# Patient Record
Sex: Female | Born: 1995 | Race: Black or African American | Hispanic: No | Marital: Single | Smoking: Never smoker
Health system: Southern US, Community
[De-identification: ages and names within clinical notes are randomized; demographics above are authoritative.]

## PROBLEM LIST (undated history)

## (undated) DIAGNOSIS — J302 Other seasonal allergic rhinitis: Secondary | ICD-10-CM

---
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
## 2015-10-16 ENCOUNTER — Emergency Department (HOSPITAL_COMMUNITY)
Admission: EM | Admit: 2015-10-16 | Discharge: 2015-10-16 | Disposition: A | Discharge status: Home / Self Care | Payer: Medicaid Other
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
## 2017-09-30 ENCOUNTER — Encounter (HOSPITAL_COMMUNITY): Payer: Self-pay

## 2017-09-30 ENCOUNTER — Emergency Department (HOSPITAL_COMMUNITY)
Admission: EM | Admit: 2017-09-30 | Discharge: 2017-09-30 | Disposition: A | Discharge status: Home / Self Care | Payer: Self-pay | Attending: Emergency Medicine | Admitting: Emergency Medicine

## 2017-09-30 ENCOUNTER — Emergency Department (HOSPITAL_COMMUNITY): Payer: Self-pay

## 2017-09-30 DIAGNOSIS — S63502A Unspecified sprain of left wrist, initial encounter: Secondary | ICD-10-CM | POA: Insufficient documentation

## 2017-09-30 DIAGNOSIS — Y999 Unspecified external cause status: Secondary | ICD-10-CM | POA: Insufficient documentation

## 2017-09-30 DIAGNOSIS — Y929 Unspecified place or not applicable: Secondary | ICD-10-CM | POA: Insufficient documentation

## 2017-09-30 DIAGNOSIS — S139XXA Sprain of joints and ligaments of unspecified parts of neck, initial encounter: Secondary | ICD-10-CM | POA: Insufficient documentation

## 2017-09-30 DIAGNOSIS — Y939 Activity, unspecified: Secondary | ICD-10-CM | POA: Insufficient documentation
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
## 2017-09-30 NOTE — Discharge Instructions (Signed)
Take over the counter medications such as naprosyn or tylenol as needed for pain, expect to be stiff and sore for the next week or so

## 2017-09-30 NOTE — ED Notes (Signed)
Ortho en route.  

## 2017-09-30 NOTE — ED Notes (Signed)
Pt ambulated out of the ED. She declined d/c vitals and this RN was just able to catch her to give her her papers.
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
## 2018-11-19 ENCOUNTER — Emergency Department (HOSPITAL_COMMUNITY)
Admission: EM | Admit: 2018-11-19 | Discharge: 2018-11-19 | Disposition: A | Discharge status: Home / Self Care | Payer: Self-pay | Attending: Emergency Medicine | Admitting: Emergency Medicine

## 2018-11-19 DIAGNOSIS — M25531 Pain in right wrist: Secondary | ICD-10-CM | POA: Insufficient documentation

## 2018-11-19 DIAGNOSIS — Z79899 Other long term (current) drug therapy: Secondary | ICD-10-CM | POA: Insufficient documentation

## 2018-11-19 MED ORDER — IBUPROFEN 800 MG PO TABS: 800.0000 mg | ORAL_TABLET | Freq: Three times a day (TID) | ORAL | 0 refills | Status: DC

## 2018-11-19 MED ORDER — IBUPROFEN 800 MG PO TABS
800.0000 mg | ORAL_TABLET | Freq: Once | ORAL | Status: AC
  Administered 2018-11-19: 800 mg via ORAL
  Filled 2018-11-19: qty 1

## 2018-11-19 NOTE — ED Provider Notes (Signed)
[facility] EMERGENCY DEPARTMENT Provider Note   CSN: [id number] Arrival date & time: 11/19/18  1649     History   Chief Complaint No chief complaint on file.   HPI Stephanie Madden is a 22 y.o. female.   Wrist Pain  This is a new problem. The current episode started 6 to 12 hours ago. The problem occurs constantly. The problem has been gradually improving. Pertinent negatives include no chest pain. The symptoms are aggravated by twisting. Nothing relieves the symptoms. She has tried nothing for the symptoms.    Past Medical History:  Diagnosis Date  . Seasonal allergies     There are no active problems to display for this patient.   No past surgical history on file.   OB History   None      Home Medications    Prior to Admission medications   Medication Sig Start Date End Date Taking? Authorizing Provider  albuterol (PROVENTIL HFA;VENTOLIN HFA) 108 (90 BASE) MCG/ACT inhaler Inhale 1-2 puffs into the lungs every 6 (six) hours as needed for wheezing or shortness of breath. 12/11/13   Santiago Glad, PA-C  benzonatate (TESSALON) 100 MG capsule Take 1 capsule (100 mg total) by mouth every 8 (eight) hours. 12/11/13   Santiago Glad, PA-C  ibuprofen (ADVIL,MOTRIN) 800 MG tablet Take 1 tablet (800 mg total) by mouth 3 (three) times daily. 11/19/18   Monia Timmers, Barbara Cower, MD  predniSONE (DELTASONE) 20 MG tablet Take 3 tablets (60 mg total) by mouth daily. 12/11/13   Santiago Glad, PA-C    Family History No family history on file.  Social History Social History   Tobacco Use  . Smoking status: Never Smoker  . Smokeless tobacco: Never Used  Substance Use Topics  . Alcohol use: No  . Drug use: No     Allergies   Patient has no known allergies.   Review of Systems Review of Systems  Cardiovascular: Negative for chest pain.  All other systems reviewed and are negative.    Physical Exam Updated Vital Signs BP 133/72 (BP Location: Right  Arm)   Pulse 74   Temp 98.7 F (37.1 C) (Oral)   Resp 18   Ht 5\' 3"  (1.6 m)   Wt 49.9 kg   SpO2 100%   BMI 19.49 kg/m   Physical Exam  Constitutional: She appears well-developed and well-nourished.  HENT:  Head: Normocephalic and atraumatic.  Eyes: Conjunctivae are normal.  Neck: Normal range of motion.  Cardiovascular: Normal rate and regular rhythm.  Pulmonary/Chest: No stridor. No respiratory distress.  Abdominal: Soft. Bowel sounds are normal. She exhibits no distension.  Musculoskeletal: Normal range of motion. She exhibits no edema or tenderness.  Neurological: She is alert.  Skin: Skin is warm and dry.  Nursing note and vitals reviewed.    ED Treatments / Results  Labs (all labs ordered are listed, but only abnormal results are displayed) Labs Reviewed - No data to display  EKG None  Radiology No results found.  Procedures Procedures (including critical care time)  Medications Ordered in ED Medications  ibuprofen (ADVIL,MOTRIN) tablet 800 mg (800 mg Oral Given 11/19/18 1723)     Initial Impression / Assessment and Plan / ED Course  I have reviewed the triage vital signs and the nursing notes.  Pertinent labs & imaging results that were available during my care of the patient were reviewed by me and considered in my medical decision making (see chart for details).  Pain with lifting something heavy to right medial wrist. No deformity. Doubt fx, no need for xr. Will splint/pcp follo wup. Nsaids.   Final Clinical Impressions(s) / ED Diagnoses   Final diagnoses:  Right wrist pain    ED Discharge Orders         Ordered    ibuprofen (ADVIL,MOTRIN) 800 MG tablet  3 times daily     11/19/18 1658           Punam Broussard, Barbara CowerJason, MD 11/19/18 2021

## 2018-11-19 NOTE — ED Notes (Signed)
Declined W/C at D/C and was escorted to lobby by RN. 

## 2019-09-19 ENCOUNTER — Emergency Department (HOSPITAL_COMMUNITY)
Admission: EM | Admit: 2019-09-19 | Discharge: 2019-09-19 | Disposition: A | Discharge status: Home / Self Care | Payer: Self-pay | Attending: Emergency Medicine | Admitting: Emergency Medicine

## 2019-09-19 ENCOUNTER — Encounter (HOSPITAL_COMMUNITY): Payer: Self-pay | Admitting: Emergency Medicine

## 2019-09-19 DIAGNOSIS — Z79899 Other long term (current) drug therapy: Secondary | ICD-10-CM | POA: Insufficient documentation

## 2019-09-19 DIAGNOSIS — Z202 Contact with and (suspected) exposure to infections with a predominantly sexual mode of transmission: Secondary | ICD-10-CM | POA: Insufficient documentation

## 2019-09-19 LAB — URINALYSIS, ROUTINE W REFLEX MICROSCOPIC
Bilirubin Urine: NEGATIVE
Glucose, UA: NEGATIVE mg/dL
Hgb urine dipstick: NEGATIVE
Ketones, ur: 5 mg/dL — AB
Nitrite: NEGATIVE
Protein, ur: NEGATIVE mg/dL
Specific Gravity, Urine: 1.024 (ref 1.005–1.030)
pH: 6 (ref 5.0–8.0)

## 2019-09-19 LAB — I-STAT BETA HCG BLOOD, ED (MC, WL, AP ONLY): I-stat hCG, quantitative: <5 m[IU]/mL (ref <5)

## 2019-09-19 LAB — HIV ANTIBODY (ROUTINE TESTING W REFLEX): HIV Screen 4th Generation wRfx: NONREACTIVE

## 2019-09-19 MED ORDER — METRONIDAZOLE 500 MG PO TABS
2000.0000 mg | ORAL_TABLET | Freq: Once | ORAL | Status: AC
  Administered 2019-09-19: 15:00:00 2000 mg via ORAL
  Filled 2019-09-19: qty 4

## 2019-09-19 NOTE — Discharge Instructions (Addendum)
Please read attached information. If you experience any new or worsening signs or symptoms please return to the emergency room for evaluation. Please follow-up with your primary care provider or specialist as discussed.  °

## 2019-09-19 NOTE — ED Triage Notes (Signed)
Pt reports was told by a female sexual partner that she was positive for trichonosis. Pt herself has no symptoms.

## 2019-09-19 NOTE — ED Provider Notes (Signed)
[facility] EMERGENCY DEPARTMENT Provider Note   CSN: [id number] Arrival date & time: 09/19/19  1126     History   Chief Complaint Chief Complaint  Patient presents with  . Exposure to STD    HPI Stephanie Madden is a 23 y.o. female.     HPI   23 year old female presents today with complaints of STD exposure.  She notes her sexual partner reported she had trichomoniasis.  Patient denies any vaginal symptoms.  Past Medical History:  Diagnosis Date  . Seasonal allergies     There are no active problems to display for this patient.   No past surgical history on file.   OB History   No obstetric history on file.      Home Medications    Prior to Admission medications   Medication Sig Start Date End Date Taking? Authorizing Provider  albuterol (PROVENTIL HFA;VENTOLIN HFA) 108 (90 BASE) MCG/ACT inhaler Inhale 1-2 puffs into the lungs every 6 (six) hours as needed for wheezing or shortness of breath. 12/11/13   Hyman Bible, PA-C  benzonatate (TESSALON) 100 MG capsule Take 1 capsule (100 mg total) by mouth every 8 (eight) hours. 12/11/13   Hyman Bible, PA-C  ibuprofen (ADVIL,MOTRIN) 800 MG tablet Take 1 tablet (800 mg total) by mouth 3 (three) times daily. 11/19/18   Mesner, Corene Cornea, MD  predniSONE (DELTASONE) 20 MG tablet Take 3 tablets (60 mg total) by mouth daily. 12/11/13   Hyman Bible, PA-C    Family History No family history on file.  Social History Social History   Tobacco Use  . Smoking status: Never Smoker  . Smokeless tobacco: Never Used  Substance Use Topics  . Alcohol use: No  . Drug use: No     Allergies   Patient has no known allergies.   Review of Systems Review of Systems  All other systems reviewed and are negative.    Physical Exam Updated Vital Signs BP (!) 120/48   Pulse 62   Temp 98.2 F (36.8 C) (Oral)   Resp 16   LMP 08/25/2019   SpO2 100%   Physical Exam Vitals signs and nursing note  reviewed.  Constitutional:      Appearance: She is well-developed.  HENT:     Head: Normocephalic and atraumatic.  Eyes:     General: No scleral icterus.       Right eye: No discharge.        Left eye: No discharge.     Conjunctiva/sclera: Conjunctivae normal.     Pupils: Pupils are equal, round, and reactive to light.  Neck:     Musculoskeletal: Normal range of motion.     Vascular: No JVD.     Trachea: No tracheal deviation.  Pulmonary:     Effort: Pulmonary effort is normal.     Breath sounds: No stridor.  Neurological:     Mental Status: She is alert and oriented to person, place, and time.     Coordination: Coordination normal.  Psychiatric:        Behavior: Behavior normal.        Thought Content: Thought content normal.        Judgment: Judgment normal.      ED Treatments / Results  Labs (all labs ordered are listed, but only abnormal results are displayed) Labs Reviewed  URINALYSIS, ROUTINE W REFLEX MICROSCOPIC - Abnormal; Notable for the following components:      Result Value   Ketones, ur 5 (*)  Leukocytes,Ua TRACE (*)    Bacteria, UA RARE (*)    All other components within normal limits  HIV ANTIBODY (ROUTINE TESTING W REFLEX)  RPR  I-STAT BETA HCG BLOOD, ED ([facility], [facility], [facility] ONLY)    EKG None  Radiology No results found.  Procedures Procedures (including critical care time)  Medications Ordered in ED Medications  metroNIDAZOLE (FLAGYL) tablet 2,000 mg (2,000 mg Oral Given 09/19/19 1504)     Initial Impression / Assessment and Plan / ED Course  I have reviewed the triage vital signs and the nursing notes.  Pertinent labs & imaging results that were available during my care of the patient were reviewed by me and considered in my medical decision making (see chart for details).        23 year old female presents today with exposure to someone with trichomoniasis.  Patient would not like pelvic exam here today.  I do find this is reasonable to  treat her prophylactically for trichomoniasis with metronidazole here.  Patient will follow-up as an outpatient with the health department, she is given strict return precautions.  She verbalized understanding and agreement to today's plan she was counseled on risks and benefits of medication.  Final Clinical Impressions(s) / ED Diagnoses   Final diagnoses:  STD exposure    ED Discharge Orders    None       Rosalio Loud 09/19/19 1753    Tilden Fossa, MD 09/21/19 [phone]

## 2019-09-21 LAB — RPR: RPR Ser Ql: NONREACTIVE

## 2019-11-17 ENCOUNTER — Encounter: Payer: Self-pay | Admitting: Emergency Medicine

## 2019-11-17 ENCOUNTER — Other Ambulatory Visit: Payer: Self-pay

## 2019-11-17 ENCOUNTER — Ambulatory Visit
Admission: EM | Admit: 2019-11-17 | Discharge: 2019-11-17 | Disposition: A | Discharge status: Home / Self Care | Payer: Self-pay | Attending: Emergency Medicine | Admitting: Emergency Medicine

## 2019-11-17 DIAGNOSIS — T148XXA Other injury of unspecified body region, initial encounter: Secondary | ICD-10-CM

## 2019-11-17 MED ORDER — NAPROXEN 500 MG PO TABS: 500.0000 mg | ORAL_TABLET | Freq: Two times a day (BID) | ORAL | 0 refills | Status: DC

## 2019-11-17 MED ORDER — CYCLOBENZAPRINE HCL 5 MG PO TABS: 5.0000 mg | ORAL_TABLET | Freq: Two times a day (BID) | ORAL | 0 refills | Status: AC | PRN

## 2019-11-17 NOTE — ED Provider Notes (Signed)
[facility]    CSN: [id number] Arrival date & time: 11/17/19  1105      History   Chief Complaint Chief Complaint  Patient presents with  . Chest Pain    HPI Stephanie Madden is a 23 y.o. female with history of seasonal allergies presenting for left-sided rib pain since beginning of this week.  States that pain is nonradiating, worse with deep cough or deep inspiration, certain movements.  States she works at [employer] does a lot of heavy lifting.  Patient denies injury, inciting event, fall.  Patient denies shortness of breath, chest pain, lightheadedness.  Has tried heating pad, APAP with moderate relief.    Past Medical History:  Diagnosis Date  . Seasonal allergies     There are no active problems to display for this patient.   History reviewed. No pertinent surgical history.  OB History   No obstetric history on file.      Home Medications    Prior to Admission medications   Medication Sig Start Date End Date Taking? Authorizing Provider  albuterol (PROVENTIL HFA;VENTOLIN HFA) 108 (90 BASE) MCG/ACT inhaler Inhale 1-2 puffs into the lungs every 6 (six) hours as needed for wheezing or shortness of breath. 12/11/13   Santiago Glad, PA-C  cyclobenzaprine (FLEXERIL) 5 MG tablet Take 1 tablet (5 mg total) by mouth 2 (two) times daily as needed for up to 5 days for muscle spasms. 11/17/19 11/22/19  Hall-Potvin, [place], PA-C  ibuprofen (ADVIL,MOTRIN) 800 MG tablet Take 1 tablet (800 mg total) by mouth 3 (three) times daily. 11/19/18   Mesner, Barbara Cower, MD  naproxen (NAPROSYN) 500 MG tablet Take 1 tablet (500 mg total) by mouth 2 (two) times daily. 11/17/19   Hall-Potvin, [place], PA-C    Family History Family History  Problem Relation Age of Onset  . Healthy Mother   . Healthy Father     Social History Social History   Tobacco Use  . Smoking status: Never Smoker  . Smokeless tobacco: Never Used  Substance Use Topics  . Alcohol use: No  . Drug use: No      Allergies   Patient has no known allergies.   Review of Systems Review of Systems  Constitutional: Negative for fatigue and fever.  HENT: Negative for ear pain, sinus pain, sore throat and voice change.   Eyes: Negative for pain, redness and visual disturbance.  Respiratory: Negative for cough and shortness of breath.   Cardiovascular: Negative for chest pain and palpitations.  Gastrointestinal: Negative for abdominal pain, diarrhea and vomiting.  Musculoskeletal:       Positive for left rib pain  Skin: Negative for rash and wound.  Neurological: Negative for syncope and headaches.     Physical Exam Triage Vital Signs ED Triage Vitals  Enc Vitals Group     BP      Pulse      Resp      Temp      Temp src      SpO2      Weight      Height      Head Circumference      Peak Flow      Pain Score      Pain Loc      Pain Edu?      Excl. in GC?    No data found.  Updated Vital Signs BP 111/68 (BP Location: Right Arm)   Pulse 61   Temp 98 F (36.7 C) (Oral)  Resp 16   LMP 10/29/2019   SpO2 100%   Visual Acuity Right Eye Distance:   Left Eye Distance:   Bilateral Distance:    Right Eye Near:   Left Eye Near:    Bilateral Near:     Physical Exam Constitutional:      General: She is not in acute distress. HENT:     Head: Normocephalic and atraumatic.  Eyes:     General: No scleral icterus.    Pupils: Pupils are equal, round, and reactive to light.  Cardiovascular:     Rate and Rhythm: Normal rate and regular rhythm.  Pulmonary:     Effort: Pulmonary effort is normal. No respiratory distress.     Breath sounds: No wheezing.  Chest:     Chest wall: No mass, swelling, crepitus or edema.    Skin:    Capillary Refill: Capillary refill takes less than 2 seconds.     Coloration: Skin is not jaundiced or pale.     Findings: No rash.  Neurological:     General: No focal deficit present.     Mental Status: She is alert and oriented to person, place,  and time.      UC Treatments / Results  Labs (all labs ordered are listed, but only abnormal results are displayed) Labs Reviewed - No data to display  EKG   Radiology No results found.  Procedures Procedures (including critical care time)  Medications Ordered in UC Medications - No data to display  Initial Impression / Assessment and Plan / UC Course  I have reviewed the triage vital signs and the nursing notes.  Pertinent labs & imaging results that were available during my care of the patient were reviewed by me and considered in my medical decision making (see chart for details).     Patient without trauma, inciting event, shortness of breath: Imaging deferred.  H&P consistent with musculoskeletal strain: We will treat supportively as outlined below.  Return precautions discussed, patient verbalized understanding and is agreeable to plan. Final Clinical Impressions(s) / UC Diagnoses   Final diagnoses:  Musculoskeletal strain     Discharge Instructions     Recommend RICE: rest, ice, compression, elevation as needed for pain.    Heat therapy (hot compress, warm wash red, hot showers, etc.) can help relax muscles and soothe muscle aches. Cold therapy (ice packs) can be used to help swelling both after injury and after prolonged use of areas of chronic pain/aches.  For pain: recommend 350 mg-1000 mg of Tylenol (acetaminophen) and/or 200 mg - 800 mg of Advil (ibuprofen, Motrin) every 8 hours as needed.  May alternate between the two throughout the day as they are generally safe to take together.  DO NOT exceed more than 3000 mg of Tylenol or 3200 mg of ibuprofen in a 24 hour period as this could damage your stomach, kidneys, liver, or increase your bleeding risk.  May take muscle relaxer as needed for severe pain / spasm.  (This medication may cause you to become tired so it is important you do not drink alcohol or operate heavy machinery while on this medication.   Recommend your first dose to be taken before bedtime to monitor for side effects safely)  Return for worsening pain, SOB, chest pain, lightheadedness    ED Prescriptions    Medication Sig Dispense Auth. Provider   naproxen (NAPROSYN) 500 MG tablet Take 1 tablet (500 mg total) by mouth 2 (two) times daily. 30 tablet Hall-Potvin,  [place], PA-C   cyclobenzaprine (FLEXERIL) 5 MG tablet Take 1 tablet (5 mg total) by mouth 2 (two) times daily as needed for up to 5 days for muscle spasms. 10 tablet Hall-Potvin, [place], PA-C     PDMP not reviewed this encounter.   Neldon [place], [place] 11/17/19 1937

## 2019-11-17 NOTE — Discharge Instructions (Signed)
Recommend RICE: rest, ice, compression, elevation as needed for pain.    Heat therapy (hot compress, warm wash red, hot showers, etc.) can help relax muscles and soothe muscle aches. Cold therapy (ice packs) can be used to help swelling both after injury and after prolonged use of areas of chronic pain/aches.  For pain: recommend 350 mg-1000 mg of Tylenol (acetaminophen) and/or 200 mg - 800 mg of Advil (ibuprofen, Motrin) every 8 hours as needed.  May alternate between the two throughout the day as they are generally safe to take together.  DO NOT exceed more than 3000 mg of Tylenol or 3200 mg of ibuprofen in a 24 hour period as this could damage your stomach, kidneys, liver, or increase your bleeding risk.  May take muscle relaxer as needed for severe pain / spasm.  (This medication may cause you to become tired so it is important you do not drink alcohol or operate heavy machinery while on this medication.  Recommend your first dose to be taken before bedtime to monitor for side effects safely)  Return for worsening pain, SOB, chest pain, lightheadedness

## 2019-11-17 NOTE — ED Triage Notes (Signed)
Pt presents to [facility] for assessment of left rib/side pain with occasional cough, which makes it worse.  Also worse with movement.  States its been going on for 4-6 days.  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

## 2020-05-23 ENCOUNTER — Ambulatory Visit (INDEPENDENT_AMBULATORY_CARE_PROVIDER_SITE_OTHER): Payer: Self-pay | Admitting: Lactation Services

## 2020-05-23 ENCOUNTER — Other Ambulatory Visit: Payer: Self-pay

## 2020-05-23 DIAGNOSIS — Z3401 Encounter for supervision of normal first pregnancy, first trimester: Secondary | ICD-10-CM

## 2020-05-23 DIAGNOSIS — Z3201 Encounter for pregnancy test, result positive: Secondary | ICD-10-CM

## 2020-05-23 LAB — POCT PREGNANCY, URINE: Preg Test, Ur: POSITIVE — AB

## 2020-05-23 MED ORDER — PROMETHAZINE HCL 25 MG PO TABS: 25.0000 mg | ORAL_TABLET | Freq: Four times a day (QID) | ORAL | 0 refills | Status: DC | PRN

## 2020-05-23 NOTE — Progress Notes (Signed)
Patient is here for pregnancy test. She reports her periods irregular and she reports she thinks her last period was early April but is unsure. She reports she has irregular periods. She has been trying to conceive since last year.   She is taking PNV daily. She is experiencing Nausea throughout the day. Phenergan ordered.   OTC med list given  Ectopic precautions given and when and where to seek medical care.   Questions answered.

## 2020-05-24 NOTE — Progress Notes (Signed)
Patient seen and assessed by nursing staff.  Agree with documentation and plan.  

## 2020-05-28 ENCOUNTER — Encounter (HOSPITAL_COMMUNITY): Payer: Self-pay | Admitting: Obstetrics and Gynecology

## 2020-05-28 ENCOUNTER — Other Ambulatory Visit: Payer: Self-pay

## 2020-05-28 ENCOUNTER — Inpatient Hospital Stay (HOSPITAL_COMMUNITY)
Admission: EM | Admit: 2020-05-28 | Discharge: 2020-05-28 | Disposition: A | Discharge status: Home / Self Care | Payer: Medicaid Other | Attending: Obstetrics and Gynecology | Admitting: Obstetrics and Gynecology

## 2020-05-28 ENCOUNTER — Inpatient Hospital Stay (HOSPITAL_COMMUNITY): Payer: Medicaid Other

## 2020-05-28 DIAGNOSIS — Z3A01 Less than 8 weeks gestation of pregnancy: Secondary | ICD-10-CM | POA: Diagnosis not present

## 2020-05-28 DIAGNOSIS — O26891 Other specified pregnancy related conditions, first trimester: Secondary | ICD-10-CM

## 2020-05-28 DIAGNOSIS — Z3490 Encounter for supervision of normal pregnancy, unspecified, unspecified trimester: Secondary | ICD-10-CM

## 2020-05-28 DIAGNOSIS — R102 Pelvic and perineal pain: Secondary | ICD-10-CM | POA: Insufficient documentation

## 2020-05-28 DIAGNOSIS — K0889 Other specified disorders of teeth and supporting structures: Secondary | ICD-10-CM | POA: Diagnosis not present

## 2020-05-28 LAB — CBC
HCT: 38.3 % (ref 36.0–46.0)
Hemoglobin: 12.7 g/dL (ref 12.0–15.0)
MCH: 27.7 pg (ref 26.0–34.0)
MCHC: 33.2 g/dL (ref 30.0–36.0)
MCV: 83.4 fL (ref 80.0–100.0)
Platelets: 297 10*3/uL (ref 150–400)
RBC: 4.59 MIL/uL (ref 3.87–5.11)
RDW: 14.5 % (ref 11.5–15.5)
WBC: 11.9 10*3/uL — ABNORMAL HIGH (ref 4.0–10.5)
nRBC: 0 % (ref 0.0–0.2)

## 2020-05-28 LAB — URINALYSIS, ROUTINE W REFLEX MICROSCOPIC
Bilirubin Urine: NEGATIVE
Glucose, UA: NEGATIVE mg/dL
Hgb urine dipstick: NEGATIVE
Ketones, ur: NEGATIVE mg/dL
Leukocytes,Ua: NEGATIVE
Nitrite: NEGATIVE
Protein, ur: NEGATIVE mg/dL
Specific Gravity, Urine: 1.016 (ref 1.005–1.030)
pH: 7 (ref 5.0–8.0)

## 2020-05-28 LAB — ABO/RH: ABO/RH(D): O POS

## 2020-05-28 LAB — WET PREP, GENITAL
Sperm: NONE SEEN
Trich, Wet Prep: NONE SEEN
WBC, Wet Prep HPF POC: NONE SEEN
Yeast Wet Prep HPF POC: NONE SEEN

## 2020-05-28 LAB — HCG, QUANTITATIVE, PREGNANCY: hCG, Beta Chain, Quant, S: 48298 m[IU]/mL — ABNORMAL HIGH (ref <5)

## 2020-05-28 MED ORDER — OXYCODONE-ACETAMINOPHEN 5-325 MG PO TABS: 1.0000 | ORAL_TABLET | ORAL | 0 refills | Status: AC | PRN

## 2020-05-28 NOTE — [facility] Note (Signed)
Sent up from ER. When woke up this morning, had a little discomfort on rt side.  Has continued, comes and goes. preg confirmed at [facility].

## 2020-05-28 NOTE — [facility] Note (Signed)
Blood work drawn. Plan explained, pt to restroom, UA and vag self swabs collected.

## 2020-05-28 NOTE — ED Provider Notes (Signed)
MSE was initiated and I personally evaluated the patient and placed orders (if any) at  1:01 PM on May 28, 2020.  24 year old female presents to the ED due to right lower quadrant abdominal pain that started earlier this morning.  Patient describes pain as intermittent and sharp in nature.  She had a positive pregnancy test on 6/3. LMP early April.  Denies vaginal bleeding and vaginal fluid.  This is her first pregnancy.  No previous miscarriages.  No history of ectopic pregnancy.  Denies vaginal discharge. G1P0.  On exam, abdomen soft, nondistended with right lower quadrant tenderness.  No rebound or guarding. + Rovsing sign. Vitals all within normal limits.  Discussed patient with Rolitta at [facility] who agrees to accept patient for further work-up.   The patient appears stable so that the remainder of the MSE may be completed by another provider.   Mannie Stabile, PA-C 05/28/20 1304    Derwood Kaplan, MD 05/28/20 1311

## 2020-05-28 NOTE — MAU Provider Note (Signed)
History     CSN: [id number]  Arrival date and time: 05/28/20 1243   First Provider Initiated Contact with Patient 05/28/20 1616      Chief Complaint  Patient presents with  . Abdominal Pain  . Possible Pregnancy   Ms. Stephanie Madden is a 24 y.o. year old G1P0 female at [redacted]w[redacted]d weeks gestation who was sent to MAU by [facility] reporting right-sided abdominal discomfort since this morning.  She states it has continued intermittently.  Positive pregnancy test in the [facility] emergency department.  She also has a complaint of her wisdom tooth coming in and experiencing dental pain from that.  She is scheduled to receive an ultrasound at the [facility] on Thursday, May 30, 2020.  She plans to receive her care there as well.   OB History    Gravida  1   Para      Term      Preterm      AB      Living        SAB      TAB      Ectopic      Multiple      Live Births              Past Medical History:  Diagnosis Date  . Seasonal allergies     History reviewed. No pertinent surgical history.  Family History  Problem Relation Age of Onset  . Healthy Mother   . Healthy Father     Social History   Tobacco Use  . Smoking status: Never Smoker  . Smokeless tobacco: Never Used  Substance Use Topics  . Alcohol use: No  . Drug use: Yes    Types: Marijuana    Comment: last use was a couple weeks ago from 05/28/20    Allergies: No Known Allergies  Medications Prior to Admission  Medication Sig Dispense Refill Last Dose  . Prenatal Vit-Fe Fumarate-FA (PRENATAL MULTIVITAMIN) TABS tablet Take 1 tablet by mouth daily at 12 noon.   05/27/2020 at Unknown time  . promethazine (PHENERGAN) 25 MG tablet Take 1 tablet (25 mg total) by mouth every 6 (six) hours as needed for nausea or vomiting. 30 tablet 0 05/27/2020 at Unknown time  . albuterol (PROVENTIL HFA;VENTOLIN HFA) 108 (90 BASE) MCG/ACT inhaler Inhale 1-2 puffs into the lungs every 6  (six) hours as needed for wheezing or shortness of breath. (Patient not taking: Reported on 05/23/2020) 1 Inhaler 0 Unknown at Unknown time  . ibuprofen (ADVIL,MOTRIN) 800 MG tablet Take 1 tablet (800 mg total) by mouth 3 (three) times daily. (Patient not taking: Reported on 05/23/2020) 21 tablet 0 Unknown at Unknown time  . naproxen (NAPROSYN) 500 MG tablet Take 1 tablet (500 mg total) by mouth 2 (two) times daily. (Patient not taking: Reported on 05/23/2020) 30 tablet 0 Unknown at Unknown time    Review of Systems  Constitutional: Negative.   HENT: Negative.   Eyes: Negative.   Respiratory: Negative.   Cardiovascular: Negative.   Gastrointestinal: Negative.   Endocrine: Negative.   Genitourinary: Positive for pelvic pain (RT side "discomfort").  Musculoskeletal: Negative.   Skin: Negative.   Allergic/Immunologic: Negative.   Neurological: Negative.   Hematological: Negative.   Psychiatric/Behavioral: Negative.    Physical Exam   Blood pressure 130/63, pulse 80, temperature 98.5 F (36.9 C), temperature source Oral, resp. rate 18, height 5\' 2"  (1.575 m), weight 53.5 kg, last menstrual  period 04/03/2020, SpO2 100 %.  Physical Exam  Constitutional: She is oriented to person, place, and time.  HENT:  Head: Normocephalic and atraumatic.  Nose: Nose normal.  Eyes: Pupils are equal, round, and reactive to light.  Cardiovascular: Normal rate and regular rhythm.  Respiratory: Effort normal.  GI: Soft. Normal appearance.  Musculoskeletal:        General: Normal range of motion.     Cervical back: Normal range of motion.  Neurological: She is alert and oriented to person, place, and time.  Skin: Skin is warm and dry.  Psychiatric: Her behavior is normal. Mood, judgment and thought content normal.   Physical Exam Constitutional:      Appearance: Normal appearance. She is normal weight.  HENT:     Head: Normocephalic and atraumatic.     Nose: Nose normal.  Eyes:     Pupils: Pupils are  equal, round, and reactive to light.  Cardiovascular:     Rate and Rhythm: Normal rate and regular rhythm.  Pulmonary:     Effort: Pulmonary effort is normal.  Abdominal:     Palpations: Abdomen is soft.  Musculoskeletal:        General: Normal range of motion.     Cervical back: Normal range of motion.  Neurological:     General: No focal deficit present.     Mental Status: She is alert and oriented to person, place, and time.  Skin:    General: Skin is warm and dry.  Psychiatric:        Mood and Affect: Mood normal.        Behavior: Behavior normal.        Thought Content: Thought content normal.        Judgment: Judgment normal.    MAU Course  Procedures  MDM [facility] UPT CBC ABO/Rh HCG Wet Prep GC/CT -- pending HIV -- pending OB < 14 wks [place] with TV  Results for orders placed or performed during the hospital encounter of 05/28/20 (from the past 24 hour(s))  CBC     Status: Abnormal   Collection Time: 05/28/20  1:36 PM  Result Value Ref Range   WBC 11.9 (H) 4.0 - 10.5 K/uL   RBC 4.59 3.87 - 5.11 MIL/uL   Hemoglobin 12.7 12.0 - 15.0 g/dL   HCT 82.5 00.3 - 70.4 %   MCV 83.4 80.0 - 100.0 fL   MCH 27.7 26.0 - 34.0 pg   MCHC 33.2 30.0 - 36.0 g/dL   RDW 88.8 91.6 - 94.5 %   Platelets 297 150 - 400 K/uL   nRBC 0.0 0.0 - 0.2 %  hCG, quantitative, pregnancy     Status: Abnormal   Collection Time: 05/28/20  1:36 PM  Result Value Ref Range   hCG, Beta Chain, Quant, S 48,298 (H) <5 mIU/mL  ABO/Rh     Status: None   Collection Time: 05/28/20  1:36 PM  Result Value Ref Range   ABO/RH(D) O POS    No rh immune globuloin      NOT A RH IMMUNE GLOBULIN CANDIDATE, PT RH POSITIVE Performed at [facility], [address], [place], [place] [address]   Wet prep, genital     Status: Abnormal   Collection Time: 05/28/20  1:50 PM   Specimen: Vaginal  Result Value Ref Range   Yeast Wet Prep HPF POC NONE SEEN NONE SEEN   Trich, Wet Prep NONE SEEN NONE SEEN   Clue Cells Wet  Prep HPF POC FEW (A) NONE SEEN   WBC, Wet Prep HPF POC NONE SEEN NONE SEEN   Sperm NONE SEEN   Urinalysis, Routine w reflex microscopic     Status: Abnormal   Collection Time: 05/28/20  2:00 PM  Result Value Ref Range   Color, Urine YELLOW YELLOW   APPearance HAZY (A) CLEAR   Specific Gravity, Urine 1.016 1.005 - 1.030   pH 7.0 5.0 - 8.0   Glucose, UA NEGATIVE NEGATIVE mg/dL   Hgb urine dipstick NEGATIVE NEGATIVE   Bilirubin Urine NEGATIVE NEGATIVE   Ketones, ur NEGATIVE NEGATIVE mg/dL   Protein, ur NEGATIVE NEGATIVE mg/dL   Nitrite NEGATIVE NEGATIVE   Leukocytes,Ua NEGATIVE NEGATIVE    US OB LESS THAN 14 WEEKS WITH OB TRANSVAGINAL  Result Date: 05/28/2020 CLINICAL DATA:  Pelvic pain EXAM: OBSTETRIC <14 WK [place] AND TRANSVAGINAL OB US TECHNIQUE: Both transabdominal and transvaginal ultrasound examinations were performed for complete evaluation of the gestation as well as the maternal uterus, adnexal regions, and pelvic cul-de-sac. Transvaginal technique was performed to assess early pregnancy. COMPARISON:  None. FINDINGS: Intrauterine gestational sac: Single Yolk sac:  Visualized. Embryo:  Visualized. Cardiac Activity: Visualized. Heart Rate: 112 bpm CRL:  4.5 mm   6 w   1 d                  [place] EDC: 01/20/2021 Subchorionic hemorrhage:  None visualized. Maternal uterus/adnexae: Left ovary measures 2.8 x 1.6 x 2.2 cm and the right ovary measures 3.7 x 1.8 x 2.3 cm. Likely corpus luteum cyst within the right ovary measures approximately 1.7 cm. No free fluid. IMPRESSION: 1. Single live intrauterine pregnancy as above, estimated age 4 weeks and 1 day. Electronically Signed   By: Randa Ngo M.D.   On: 05/28/2020 15:10     Assessment and Plan  Intrauterine pregnancy  - Seek PNC in 4 wks at [facility]  Pelvic pain in pregnancy, antepartum, first trimester  - Information provided on abdominal pain in pregnancy   Pain, dental - Rx for Percocet 5/325 mg 1 tablet every 4 hrs prn pain  - Discharge  home - Call [facility] to schedule PNC at 10-[redacted] wks gestation - Patient verbalized an understanding of the plan of care and agrees.    Laury Deep, MSN, CNM 05/28/2020, 4:16 PM

## 2020-05-28 NOTE — Discharge Instructions (Signed)

## 2020-05-28 NOTE — ED Notes (Signed)
Report given to MAU RN by this RN.

## 2020-05-28 NOTE — ED Notes (Signed)
PA Aberman in to assess patient for possible transport to MAU.

## 2020-05-29 LAB — GC/CHLAMYDIA PROBE AMP (~~LOC~~) NOT AT ARMC
Chlamydia: NEGATIVE
Comment: NEGATIVE
Comment: NORMAL
Neisseria Gonorrhea: NEGATIVE

## 2020-05-30 ENCOUNTER — Ambulatory Visit: Payer: Self-pay

## 2020-06-05 ENCOUNTER — Inpatient Hospital Stay (HOSPITAL_COMMUNITY)
Admission: AD | Admit: 2020-06-05 | Discharge: 2020-06-05 | Disposition: A | Discharge status: Home / Self Care | Payer: Medicaid Other | Attending: Obstetrics & Gynecology | Admitting: Obstetrics & Gynecology

## 2020-06-05 ENCOUNTER — Encounter (HOSPITAL_COMMUNITY): Payer: Self-pay | Admitting: Obstetrics & Gynecology

## 2020-06-05 ENCOUNTER — Other Ambulatory Visit: Payer: Self-pay

## 2020-06-05 DIAGNOSIS — O26891 Other specified pregnancy related conditions, first trimester: Secondary | ICD-10-CM | POA: Diagnosis not present

## 2020-06-05 DIAGNOSIS — Z3A01 Less than 8 weeks gestation of pregnancy: Secondary | ICD-10-CM | POA: Diagnosis not present

## 2020-06-05 DIAGNOSIS — O219 Vomiting of pregnancy, unspecified: Secondary | ICD-10-CM | POA: Diagnosis not present

## 2020-06-05 DIAGNOSIS — R1030 Lower abdominal pain, unspecified: Secondary | ICD-10-CM | POA: Insufficient documentation

## 2020-06-05 DIAGNOSIS — R112 Nausea with vomiting, unspecified: Secondary | ICD-10-CM | POA: Diagnosis present

## 2020-06-05 DIAGNOSIS — R109 Unspecified abdominal pain: Secondary | ICD-10-CM

## 2020-06-05 LAB — URINALYSIS, ROUTINE W REFLEX MICROSCOPIC
Bacteria, UA: NONE SEEN
Bilirubin Urine: NEGATIVE
Glucose, UA: 50 mg/dL — AB
Hgb urine dipstick: NEGATIVE
Ketones, ur: 80 mg/dL — AB
Leukocytes,Ua: NEGATIVE
Nitrite: NEGATIVE
Protein, ur: 100 mg/dL — AB
Specific Gravity, Urine: 1.038 — ABNORMAL HIGH (ref 1.005–1.030)
pH: 5 (ref 5.0–8.0)

## 2020-06-05 MED ORDER — ONDANSETRON HCL 4 MG PO TABS: 4.0000 mg | ORAL_TABLET | Freq: Three times a day (TID) | ORAL | 0 refills | Status: DC | PRN

## 2020-06-05 MED ORDER — METOCLOPRAMIDE HCL 10 MG PO TABS
10.0000 mg | ORAL_TABLET | Freq: Three times a day (TID) | ORAL | 2 refills | Status: DC | PRN
Start: 2020-06-05 — End: 2021-10-19

## 2020-06-05 MED ORDER — LACTATED RINGERS IV BOLUS
1000.0000 mL | Freq: Once | INTRAVENOUS | Status: AC
  Administered 2020-06-05: 1000 mL via INTRAVENOUS

## 2020-06-05 MED ORDER — ONDANSETRON HCL 4 MG/2ML IJ SOLN
4.0000 mg | Freq: Once | INTRAMUSCULAR | Status: AC
  Administered 2020-06-05: 4 mg via INTRAVENOUS
  Filled 2020-06-05: qty 2

## 2020-06-05 MED ORDER — FAMOTIDINE IN NACL 20-0.9 MG/50ML-% IV SOLN
20.0000 mg | Freq: Once | INTRAVENOUS | Status: AC
  Administered 2020-06-05: 20 mg via INTRAVENOUS
  Filled 2020-06-05: qty 50

## 2020-06-05 MED ORDER — M.V.I. ADULT IV INJ
Freq: Once | INTRAVENOUS | Status: AC
  Filled 2020-06-05: qty 1000

## 2020-06-05 MED ORDER — PROMETHAZINE HCL 25 MG/ML IJ SOLN
12.5000 mg | Freq: Once | INTRAMUSCULAR | Status: AC
  Administered 2020-06-05: 12.5 mg via INTRAVENOUS
  Filled 2020-06-05: qty 1

## 2020-06-05 MED ORDER — GLYCOPYRROLATE 2 MG PO TABS: 2.0000 mg | ORAL_TABLET | Freq: Three times a day (TID) | ORAL | 3 refills | Status: DC | PRN

## 2020-06-05 MED ORDER — GLYCOPYRROLATE 0.2 MG/ML IJ SOLN
0.2000 mg | Freq: Once | INTRAMUSCULAR | Status: AC
  Administered 2020-06-05: 0.2 mg via INTRAVENOUS
  Filled 2020-06-05: qty 1

## 2020-06-05 NOTE — Discharge Instructions (Signed)
[facility] Prenatal Care Providers          [facility] locations:  Hours may vary. Please call for an appointment  [facility] @ [facility]  [address], [place] [phone]  [facility] @ [facility]  [address], [place] [phone]  [facility] @ [facility]   [address], [place]  [phone]  [facility] @ [facility] [facility]       [address], [place], [place] [phone]            [facility] @ [place]     [address], [place], [place] [phone]          [facility] @ [place]   [address], [place], [place] [phone]     [facility] @ [facility]  [address] , [place], [place]  [phone] 

## 2020-06-05 NOTE — MAU Provider Note (Signed)
Chief Complaint: Morning Sickness, Nausea, Emesis, and Cramping   First Provider Initiated Contact with Patient 06/05/20 1419      SUBJECTIVE HPI: Stephanie Madden is a 24 y.o. G1P0 at [redacted]w[redacted]d by early ultrasound who presents to maternity admissions reporting nausea with vomiting 10+ times in 24 hours, abdominal cramping and possible bleeding. Pt noticed spots on her sheets that she thought might be blood. There has been no blood when wiping or noticed in her underwear.  She denies vaginal itching/burning, urinary symptoms, h/a, dizziness, or fever/chills.     Location: lower abdomen Quality: cramping Severity: 5/10 on pain scale Duration: 2 days Timing: intermittent Modifying factors: PO Phenergan is not staying down Associated signs and symptoms: n/v  HPI  Past Medical History:  Diagnosis Date  . Seasonal allergies    History reviewed. No pertinent surgical history. Social History   Socioeconomic History  . Marital status: Single    Spouse name: Not on file  . Number of children: Not on file  . Years of education: Not on file  . Highest education level: Not on file  Occupational History  . Not on file  Tobacco Use  . Smoking status: Never Smoker  . Smokeless tobacco: Never Used  Vaping Use  . Vaping Use: Some days  . Start date: 04/20/2020  Substance and Sexual Activity  . Alcohol use: No  . Drug use: Yes    Types: Marijuana    Comment: last use was a couple weeks ago from 05/28/20  . Sexual activity: Yes    Birth control/protection: None  Other Topics Concern  . Not on file  Social History Narrative  . Not on file   Social Determinants of Health   Financial Resource Strain:   . Difficulty of Paying Living Expenses:   Food Insecurity:   . Worried About Programme researcher, broadcasting/film/video in the Last Year:   . Barista in the Last Year:   Transportation Needs:   . Freight forwarder (Medical):   Marland Kitchen Lack of Transportation (Non-Medical):   Physical Activity:   . Days  of Exercise per Week:   . Minutes of Exercise per Session:   Stress:   . Feeling of Stress :   Social Connections:   . Frequency of Communication with Friends and Family:   . Frequency of Social Gatherings with Friends and Family:   . Attends Religious Services:   . Active Member of Clubs or Organizations:   . Attends Banker Meetings:   Marland Kitchen Marital Status:   Intimate Partner Violence:   . Fear of Current or Ex-Partner:   . Emotionally Abused:   Marland Kitchen Physically Abused:   . Sexually Abused:    No current facility-administered medications on file prior to encounter.   Current Outpatient Medications on File Prior to Encounter  Medication Sig Dispense Refill  . albuterol (PROVENTIL HFA;VENTOLIN HFA) 108 (90 BASE) MCG/ACT inhaler Inhale 1-2 puffs into the lungs every 6 (six) hours as needed for wheezing or shortness of breath. 1 Inhaler 0  . Prenatal Vit-Fe Fumarate-FA (PRENATAL MULTIVITAMIN) TABS tablet Take 1 tablet by mouth daily at 12 noon.    . promethazine (PHENERGAN) 25 MG tablet Take 1 tablet (25 mg total) by mouth every 6 (six) hours as needed for nausea or vomiting. 30 tablet 0   No Known Allergies  ROS:  Review of Systems  Constitutional: Negative for chills, fatigue and fever.  Respiratory: Negative for shortness of breath.  Cardiovascular: Negative for chest pain.  Gastrointestinal: Positive for abdominal pain, nausea and vomiting.  Genitourinary: Negative for difficulty urinating, dysuria, flank pain, pelvic pain, vaginal bleeding, vaginal discharge and vaginal pain.  Neurological: Negative for dizziness and headaches.  Psychiatric/Behavioral: Negative.      I have reviewed patient's Past Medical Hx, Surgical Hx, Family Hx, Social Hx, medications and allergies.   Physical Exam   Patient Vitals for the past 24 hrs:  BP Temp Temp src Pulse Resp SpO2 Height Weight  06/05/20 1237 134/61 97.7 F (36.5 C) Oral 68 20 100 % 5\' 3"  (1.6 m) 50.2 kg    Constitutional: Well-developed, well-nourished female in no acute distress.  Cardiovascular: normal rate Respiratory: normal effort GI: Abd soft, non-tender. Pos BS x 4 MS: Extremities nontender, no edema, normal ROM Neurologic: Alert and oriented x 4.  GU: Neg CVAT.  PELVIC EXAM: Cervix pink, visually closed, without lesion, scant Felter creamy discharge, vaginal walls and external genitalia normal Bimanual exam: Cervix 0/long/high, firm, anterior, neg CMT, uterus nontender, nonenlarged, adnexa without tenderness, enlargement, or mass   LAB RESULTS Results for orders placed or performed during the hospital encounter of 06/05/20 (from the past 24 hour(s))  Urinalysis, Routine w reflex microscopic     Status: Abnormal   Collection Time: 06/05/20  1:00 PM  Result Value Ref Range   Color, Urine YELLOW YELLOW   APPearance HAZY (A) CLEAR   Specific Gravity, Urine 1.038 (H) 1.005 - 1.030   pH 5.0 5.0 - 8.0   Glucose, UA 50 (A) NEGATIVE mg/dL   Hgb urine dipstick NEGATIVE NEGATIVE   Bilirubin Urine NEGATIVE NEGATIVE   Ketones, ur 80 (A) NEGATIVE mg/dL   Protein, ur 100 (A) NEGATIVE mg/dL   Nitrite NEGATIVE NEGATIVE   Leukocytes,Ua NEGATIVE NEGATIVE   RBC / HPF 0-5 0 - 5 RBC/hpf   WBC, UA 0-5 0 - 5 WBC/hpf   Bacteria, UA NONE SEEN NONE SEEN   Squamous Epithelial / LPF 11-20 0 - 5   Mucus PRESENT     --/--/O POS (06/08 1336)  IMAGING  Bedside US today indicates 7 week IUP with FHR 150s.    US OB LESS THAN 14 WEEKS WITH OB TRANSVAGINAL  Result Date: 05/28/2020 CLINICAL DATA:  Pelvic pain EXAM: OBSTETRIC <14 WK [place] AND TRANSVAGINAL OB US TECHNIQUE: Both transabdominal and transvaginal ultrasound examinations were performed for complete evaluation of the gestation as well as the maternal uterus, adnexal regions, and pelvic cul-de-sac. Transvaginal technique was performed to assess early pregnancy. COMPARISON:  None. FINDINGS: Intrauterine gestational sac: Single Yolk sac:  Visualized.  Embryo:  Visualized. Cardiac Activity: Visualized. Heart Rate: 112 bpm CRL:  4.5 mm   6 w   1 d                  [place] EDC: 01/20/2021 Subchorionic hemorrhage:  None visualized. Maternal uterus/adnexae: Left ovary measures 2.8 x 1.6 x 2.2 cm and the right ovary measures 3.7 x 1.8 x 2.3 cm. Likely corpus luteum cyst within the right ovary measures approximately 1.7 cm. No free fluid. IMPRESSION: 1. Single live intrauterine pregnancy as above, estimated age 37 weeks and 1 day. Electronically Signed   By: Randa Ngo M.D.   On: 05/28/2020 15:10    MAU Management/MDM: Orders Placed This Encounter  Procedures  . Urinalysis, Routine w reflex microscopic  . Discharge patient    Meds ordered this encounter  Medications  . lactated ringers bolus 1,000 mL  . promethazine (PHENERGAN)  injection 12.5 mg  . dextrose 5% lactated ringers 1,000 mL with multivitamins adult (INFUVITE ADULT) 10 mL infusion  . glycopyrrolate (ROBINUL) injection 0.2 mg  . famotidine (PEPCID) IVPB 20 mg premix  . ondansetron (ZOFRAN) injection 4 mg  . metoCLOPramide (REGLAN) 10 MG tablet    Sig: Take 1 tablet (10 mg total) by mouth 3 (three) times daily with meals as needed for nausea.    Dispense:  30 tablet    Refill:  2    Order Specific Question:   Supervising Provider    Answer:   Reva Bores [[id number]]  . glycopyrrolate (ROBINUL) 2 MG tablet    Sig: Take 1 tablet (2 mg total) by mouth 3 (three) times daily as needed.    Dispense:  30 tablet    Refill:  3    Order Specific Question:   Supervising Provider    Answer:   Reva Bores [[id number]]  . ondansetron (ZOFRAN) 4 MG tablet    Sig: Take 1 tablet (4 mg total) by mouth every 8 (eight) hours as needed for nausea or vomiting.    Dispense:  20 tablet    Refill:  0    Order Specific Question:   Supervising Provider    Answer:   Reva Bores [[id number]]    Bedside US reassuring for pt today, no evidence of vaginal bleeding with improved abdominal pain after IV fluids. Pt with  mild dehydration noted on UA with high SG and ketones.  IV fluids replaced with LR x 1000 ml and multivitamins and D5LR x 1000 ml.  Phenergan 12.5 mg IV, Pepcid 20 mg IVPB, and Robinul 0.2 mg IV given.  Pt improved but vomited additional 2 times after PO fluids.  Discussed with the patient the risk of Zofran use in the first trimester of pregnancy. Risks of use in the first trimester include birth defects in babies, specifically cleft lip/palate.  Pt states understanding and plans to use Zofran sparingly.  Zofran 4 mg IV given x 1 in MAU. D/C home with Rx for Reglan in the daytime, Phenergan at night, Robinul for increased saliva, and Zofran for sparing use for breakthrough nausea.  Discussed use of Phenergan vaginally when unable to keep down PO medication.  Pt to f/u with early prenatal care, given [facility] office contact information.    Pt discharged with strict return precautions.  ASSESSMENT 1. Nausea and vomiting during pregnancy prior to [redacted] weeks gestation   2. Abdominal pain during pregnancy in first trimester     PLAN Discharge home Allergies as of 06/05/2020   No Known Allergies     Medication List    TAKE these medications   albuterol 108 (90 Base) MCG/ACT inhaler Commonly known as: VENTOLIN HFA Inhale 1-2 puffs into the lungs every 6 (six) hours as needed for wheezing or shortness of breath.   glycopyrrolate 2 MG tablet Commonly known as: ROBINUL Take 1 tablet (2 mg total) by mouth 3 (three) times daily as needed.   metoCLOPramide 10 MG tablet Commonly known as: REGLAN Take 1 tablet (10 mg total) by mouth 3 (three) times daily with meals as needed for nausea.   ondansetron 4 MG tablet Commonly known as: Zofran Take 1 tablet (4 mg total) by mouth every 8 (eight) hours as needed for nausea or vomiting.   prenatal multivitamin Tabs tablet Take 1 tablet by mouth daily at 12 noon.   promethazine 25 MG tablet Commonly known as: PHENERGAN Take 1 tablet (25  mg total) by mouth  every 6 (six) hours as needed for nausea or vomiting.       Follow-up Information    [facility] Follow up.   Specialty: Obstetrics and Gynecology Why: Or prenatal provider of your choice, see list provided.  Return to MAU as needed for emergencies. Contact information: [address] [place] [address] [phone]              Sharen Counter Certified Nurse-Midwife 06/05/2020  5:54 PM

## 2020-06-05 NOTE — MAU Note (Addendum)
Presents with c/o N&V, states benn vomiting since yesterday and keep anything down.  Reports diarrhea x2 episodes in 24 hours.  Also states having lower abdominal cramping, notices 2 drops of blood on sheets when she woke up this morning.

## 2021-01-14 IMAGING — US US OB < 14 WEEKS - US OB TV
1 series · 15 of 28 positions shown · non-contrast
Comparison: None.

CLINICAL DATA: Pelvic pain

EXAM:
OBSTETRIC <14 WK US AND TRANSVAGINAL OB US
TECHNIQUE: Both transabdominal and transvaginal ultrasound examinations were
performed for complete evaluation of the gestation as well as the
maternal uterus, adnexal regions, and pelvic cul-de-sac.
Transvaginal technique was performed to assess early pregnancy.

[Series 1: us ob < 14 weeks - us ob tv · 15 of 73 slices shown]
[im 1/73]
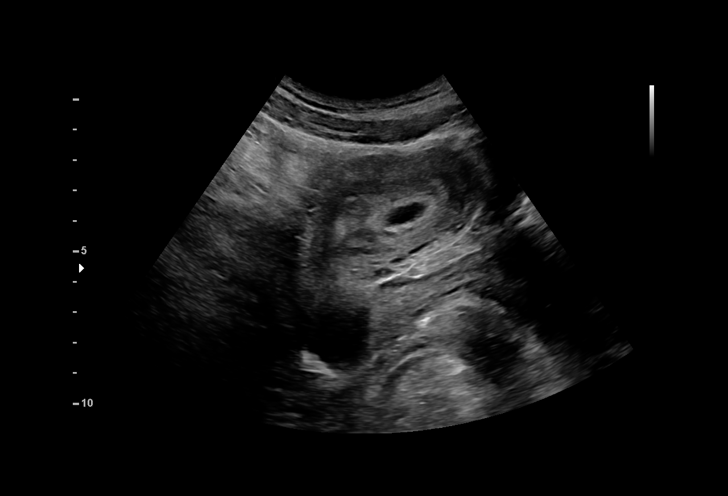
[im 6/73]
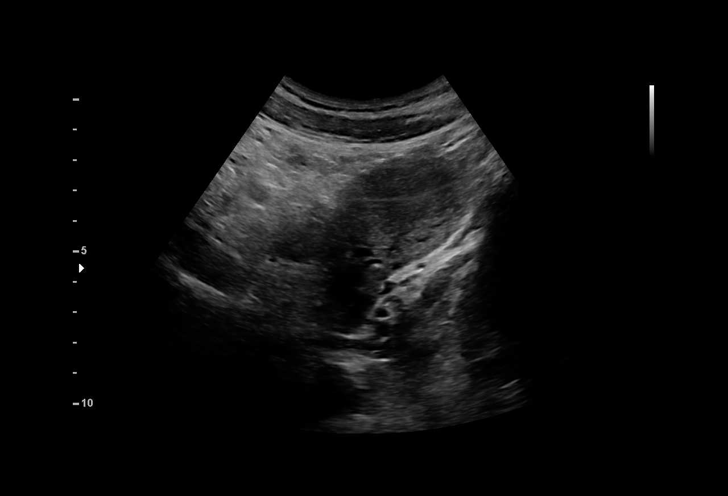
[im 11/73]
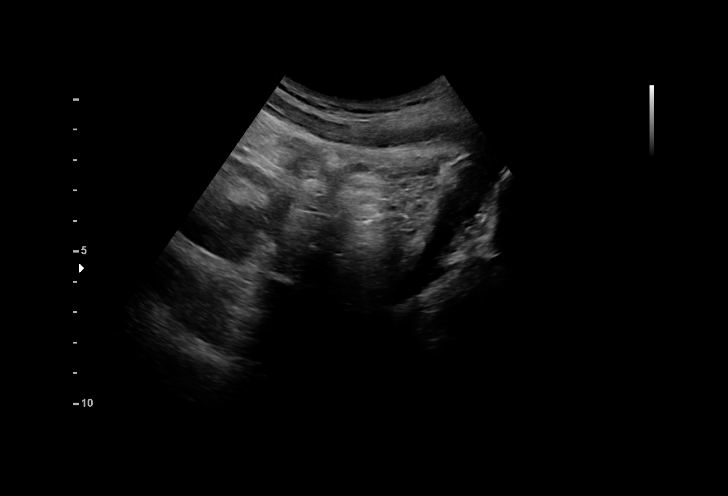
[im 17/73]
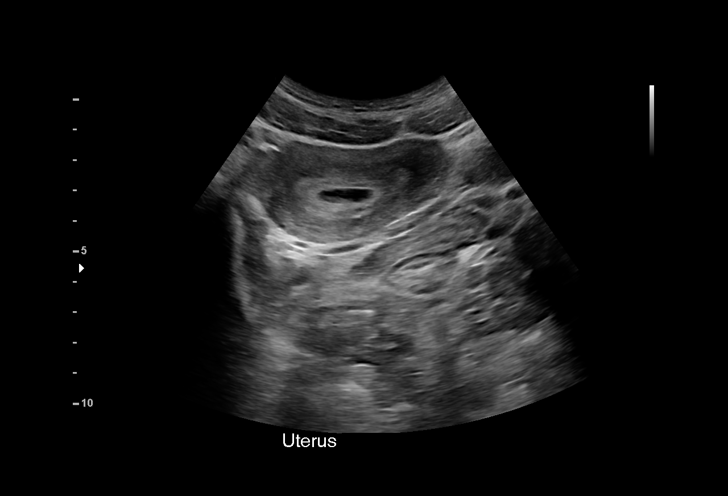
[im 22/73]
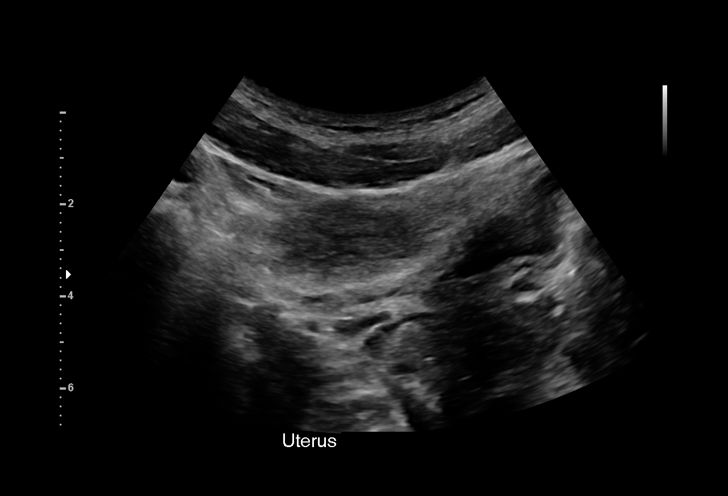
[im 27/73]
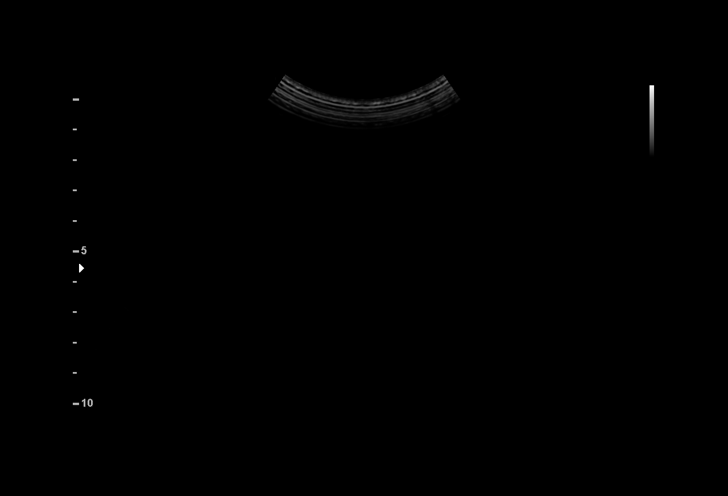
[im 33/73]
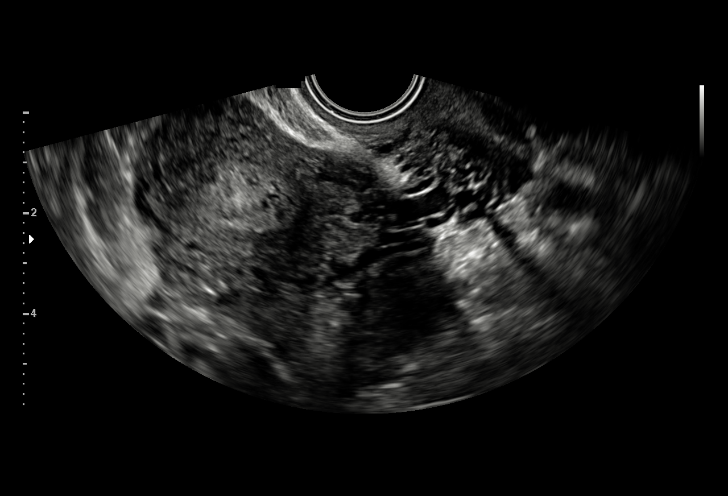
[im 38/73]
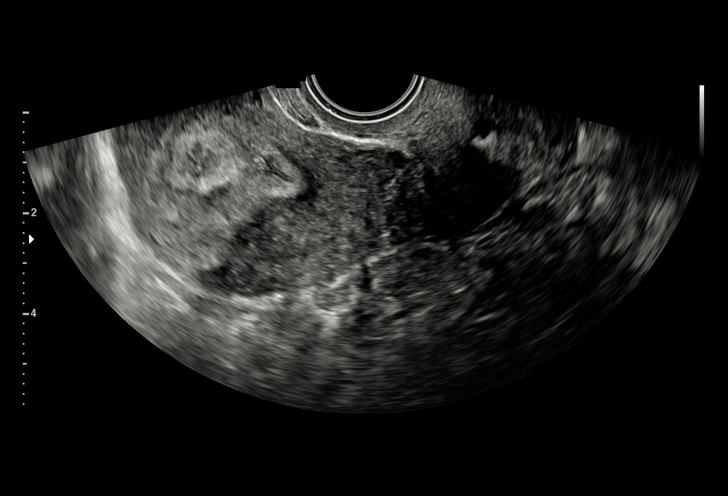
[im 41/73]
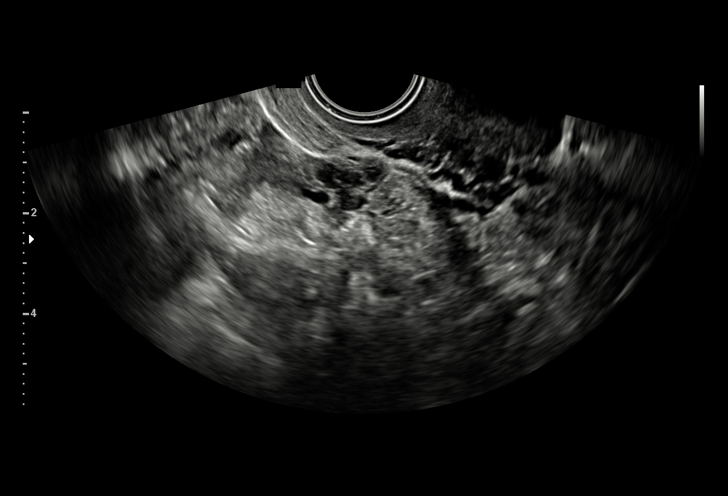
[im 46/73]
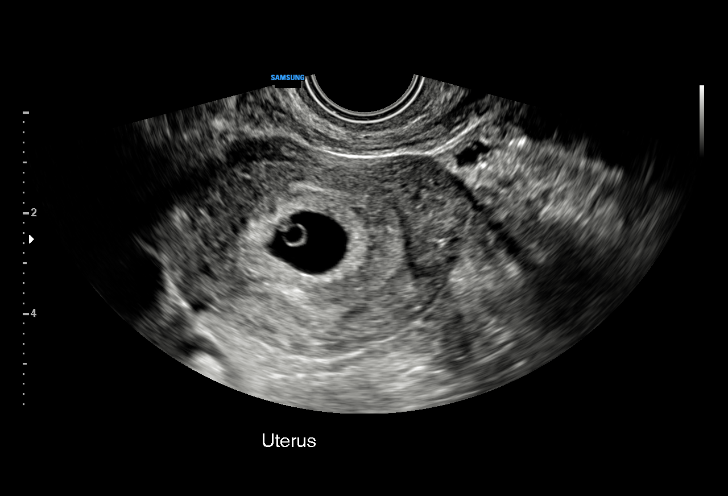
[im 51/73]
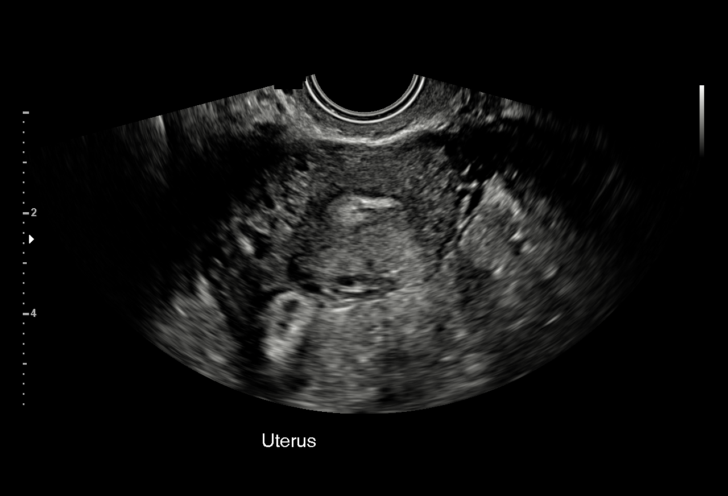
[im 57/73]
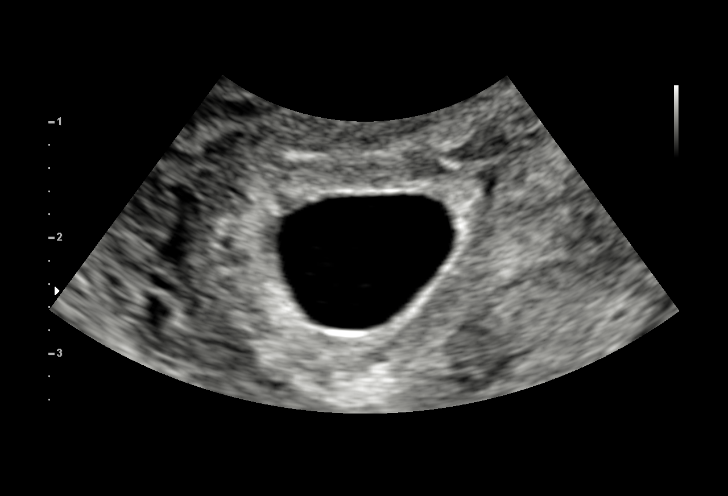
[im 62/73]
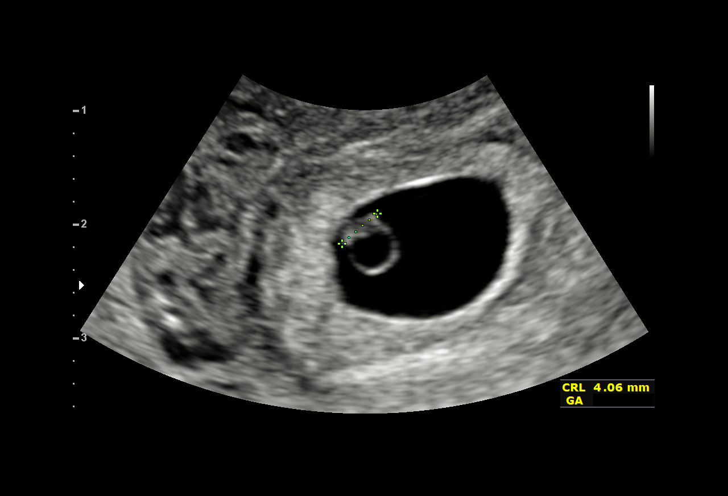
[im 67/73]
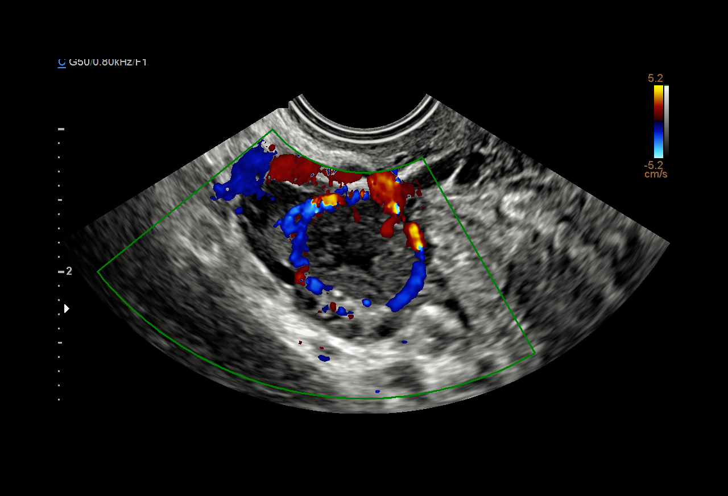
[im 73/73]
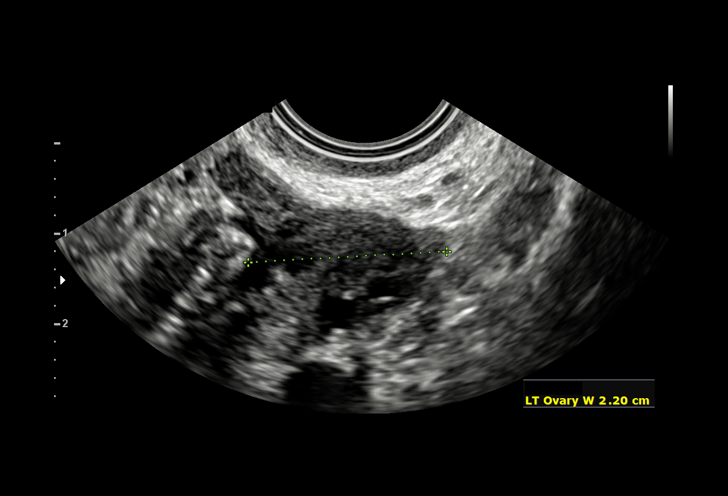

[15 of 28 positions shown; findings below may reference images not displayed]

FINDINGS: Intrauterine gestational sac: Single

Yolk sac:  Visualized.

Embryo:  Visualized.

Cardiac Activity: Visualized.

Heart Rate: 112 bpm

CRL:  4.5 mm   6 w   1 d                  US EDC: 01/20/2021

Subchorionic hemorrhage:  None visualized.

Maternal uterus/adnexae: Left ovary measures 2.8 x 1.6 x 2.2 cm and
the right ovary measures 3.7 x 1.8 x 2.3 cm. Likely corpus luteum
cyst within the right ovary measures approximately 1.7 cm. No free
fluid.
IMPRESSION: 1. Single live intrauterine pregnancy as above, estimated age 6
weeks and 1 day.

## 2021-10-06 ENCOUNTER — Ambulatory Visit (INDEPENDENT_AMBULATORY_CARE_PROVIDER_SITE_OTHER): Payer: Medicaid Other

## 2021-10-06 DIAGNOSIS — Z3201 Encounter for pregnancy test, result positive: Secondary | ICD-10-CM

## 2021-10-06 LAB — POCT PREGNANCY, URINE: Preg Test, Ur: POSITIVE — AB

## 2021-10-06 NOTE — Progress Notes (Signed)
Pt dropped off urine today for UPT. UPT is positive.  Call placed to pt several times with no answer and unable to leave VM. Pt advised to return call to office for results.   Judeth Cornfield, RN

## 2021-10-18 ENCOUNTER — Encounter (HOSPITAL_COMMUNITY): Payer: Self-pay | Admitting: Obstetrics & Gynecology

## 2021-10-18 ENCOUNTER — Inpatient Hospital Stay (HOSPITAL_COMMUNITY)
Admission: AD | Admit: 2021-10-18 | Discharge: 2021-10-19 | Disposition: A | Discharge status: Home / Self Care | Payer: Medicaid Other | Attending: Obstetrics & Gynecology | Admitting: Obstetrics & Gynecology

## 2021-10-18 ENCOUNTER — Other Ambulatory Visit: Payer: Self-pay

## 2021-10-18 DIAGNOSIS — Z3401 Encounter for supervision of normal first pregnancy, first trimester: Secondary | ICD-10-CM

## 2021-10-18 DIAGNOSIS — Z3A01 Less than 8 weeks gestation of pregnancy: Secondary | ICD-10-CM

## 2021-10-18 DIAGNOSIS — O219 Vomiting of pregnancy, unspecified: Secondary | ICD-10-CM | POA: Diagnosis present

## 2021-10-18 DIAGNOSIS — E876 Hypokalemia: Secondary | ICD-10-CM | POA: Diagnosis not present

## 2021-10-18 DIAGNOSIS — O210 Mild hyperemesis gravidarum: Secondary | ICD-10-CM

## 2021-10-18 DIAGNOSIS — O99281 Endocrine, nutritional and metabolic diseases complicating pregnancy, first trimester: Secondary | ICD-10-CM | POA: Diagnosis not present

## 2021-10-18 LAB — URINALYSIS, ROUTINE W REFLEX MICROSCOPIC
Bilirubin Urine: NEGATIVE
Glucose, UA: NEGATIVE mg/dL
Hgb urine dipstick: NEGATIVE
Ketones, ur: 80 mg/dL — AB
Leukocytes,Ua: NEGATIVE
Nitrite: NEGATIVE
Protein, ur: 100 mg/dL — AB
Specific Gravity, Urine: 1.034 — ABNORMAL HIGH (ref 1.005–1.030)
pH: 5 (ref 5.0–8.0)

## 2021-10-18 LAB — COMPREHENSIVE METABOLIC PANEL
ALT: 14 U/L (ref 0–44)
AST: 16 U/L (ref 15–41)
Albumin: 4.4 g/dL (ref 3.5–5.0)
Alkaline Phosphatase: 57 U/L (ref 38–126)
Anion gap: 12 (ref 5–15)
BUN: 8 mg/dL (ref 6–20)
CO2: 21 mmol/L — ABNORMAL LOW (ref 22–32)
Calcium: 9.9 mg/dL (ref 8.9–10.3)
Chloride: 100 mmol/L (ref 98–111)
Creatinine, Ser: 0.73 mg/dL (ref 0.44–1.00)
GFR, Estimated: >60 mL/min (ref >60)
Glucose, Bld: 103 mg/dL — ABNORMAL HIGH (ref 70–99)
Potassium: 3 mmol/L — ABNORMAL LOW (ref 3.5–5.1)
Sodium: 133 mmol/L — ABNORMAL LOW (ref 135–145)
Total Bilirubin: 3 mg/dL — ABNORMAL HIGH (ref 0.3–1.2)
Total Protein: 8.4 g/dL — ABNORMAL HIGH (ref 6.5–8.1)

## 2021-10-18 MED ORDER — SODIUM CHLORIDE 0.9 % IV SOLN
8.0000 mg | Freq: Once | INTRAVENOUS | Status: AC
  Administered 2021-10-18: 8 mg via INTRAVENOUS
  Filled 2021-10-18: qty 4

## 2021-10-18 MED ORDER — FAMOTIDINE IN NACL 20-0.9 MG/50ML-% IV SOLN
20.0000 mg | Freq: Once | INTRAVENOUS | Status: AC
  Administered 2021-10-18: 20 mg via INTRAVENOUS
  Filled 2021-10-18: qty 50

## 2021-10-18 MED ORDER — LACTATED RINGERS IV BOLUS
1000.0000 mL | Freq: Once | INTRAVENOUS | Status: AC
  Administered 2021-10-18: 1000 mL via INTRAVENOUS

## 2021-10-18 NOTE — MAU Note (Signed)
Pt reports she has not been able to keep anything down for 3-4 days. Pt took her mothers zofran but it did not help.

## 2021-10-18 NOTE — MAU Provider Note (Addendum)
History     CSN: [id number]  Arrival date and time: 10/18/21 1839   None     Chief Complaint  Patient presents with   Emesis   HPI  [name] is a 25 y.o. female G2P0 @ [redacted]w[redacted]d here in MAU with complaints of Nausea and vomiting. Reports not keeping anything down for 3-4 days. She took a zofran that she had at home which did not help her nausea. She has occasional pain all over her abdomen, however she has no pain currently. She reports no bleeding.   OB History     Gravida  2   Para      Term      Preterm      AB      Living         SAB      IAB      Ectopic      Multiple      Live Births              Past Medical History:  Diagnosis Date   Seasonal allergies     No past surgical history on file.  Family History  Problem Relation Age of Onset   Healthy Mother    Healthy Father     Social History   Tobacco Use   Smoking status: Never   Smokeless tobacco: Never  Vaping Use   Vaping Use: Some days   Start date: 04/20/2020  Substance Use Topics   Alcohol use: No   Drug use: Yes    Types: Marijuana    Comment: last use was a couple weeks ago from 05/28/20    Allergies: No Known Allergies  Medications Prior to Admission  Medication Sig Dispense Refill Last Dose   Prenatal Vit-Fe Fumarate-FA (PRENATAL MULTIVITAMIN) TABS tablet Take 1 tablet by mouth daily at 12 noon.   10/18/2021   albuterol (PROVENTIL HFA;VENTOLIN HFA) 108 (90 BASE) MCG/ACT inhaler Inhale 1-2 puffs into the lungs every 6 (six) hours as needed for wheezing or shortness of breath. 1 Inhaler 0 Unknown   glycopyrrolate (ROBINUL) 2 MG tablet Take 1 tablet (2 mg total) by mouth 3 (three) times daily as needed. (Patient not taking: Reported on 10/18/2021) 30 tablet 3 Not Taking   metoCLOPramide (REGLAN) 10 MG tablet Take 1 tablet (10 mg total) by mouth 3 (three) times daily with meals as needed for nausea. (Patient not taking: Reported on 10/18/2021) 30 tablet 2 Not Taking    ondansetron (ZOFRAN) 4 MG tablet Take 1 tablet (4 mg total) by mouth every 8 (eight) hours as needed for nausea or vomiting. (Patient not taking: Reported on 10/18/2021) 20 tablet 0 Not Taking   promethazine (PHENERGAN) 25 MG tablet Take 1 tablet (25 mg total) by mouth every 6 (six) hours as needed for nausea or vomiting. (Patient not taking: Reported on 10/18/2021) 30 tablet 0 Not Taking   No results found for this or any previous visit (from the past 48 hour(s)).   Review of Systems  Gastrointestinal:  Positive for nausea and vomiting. Negative for abdominal pain.  Genitourinary:  Negative for vaginal bleeding.  Physical Exam   Blood pressure 134/82, pulse 75, temperature 98 F (36.7 C), resp. rate 18, height 5\' 3"  (1.6 m), weight 48.5 kg, last menstrual period 08/26/2021, unknown if currently breastfeeding.  Physical Exam Vitals reviewed.  Constitutional:      Appearance: She is normal weight. She is ill-appearing. She is not toxic-appearing.  HENT:  Head: Normocephalic.  Eyes:     Pupils: Pupils are equal, round, and reactive to light.  Abdominal:     Palpations: Abdomen is soft.     Tenderness: There is no abdominal tenderness.  Musculoskeletal:        General: Normal range of motion.  Skin:    General: Skin is warm.  Neurological:     Mental Status: She is alert and oriented to person, place, and time.   MAU Course  Procedures None  MDM  LR bolus x 2 CBC & CMP Pepcid and Zofran ordered IV Report given to Camelia Eng CNM who resumes care of the patient.   Venia Carbon I, NP 10/18/2021  Patient was able to tolerate PO fluids however right before discharge vomited. Phenergan IV was given with improvement in nausea Assessment and Plan  Nausea and vomiting in pregnancy Hypokalemia  - Patient discharged in stable condition - Strict return precautions reviewed. Return to MAU as needed - Keep OB appointment as scheduled - Phenergan and K CLOR sent to  pharmacy   Brand Males, CNM 10/19/21 2:05 AM

## 2021-10-19 MED ORDER — SODIUM CHLORIDE 0.9 % IV SOLN
12.5000 mg | Freq: Once | INTRAVENOUS | Status: AC
  Administered 2021-10-19: 12.5 mg via INTRAVENOUS
  Filled 2021-10-19: qty 12.5

## 2021-10-19 MED ORDER — POTASSIUM CHLORIDE CRYS ER 20 MEQ PO TBCR: 20.0000 meq | EXTENDED_RELEASE_TABLET | Freq: Two times a day (BID) | ORAL | 0 refills | Status: AC

## 2021-10-19 MED ORDER — PROMETHAZINE HCL 25 MG PO TABS: 25.0000 mg | ORAL_TABLET | Freq: Four times a day (QID) | ORAL | 0 refills | Status: AC | PRN

## 2021-11-10 ENCOUNTER — Other Ambulatory Visit: Payer: Self-pay

## 2021-11-10 DIAGNOSIS — Z3480 Encounter for supervision of other normal pregnancy, unspecified trimester: Secondary | ICD-10-CM | POA: Insufficient documentation

## 2021-11-10 MED ORDER — GOJJI WEIGHT SCALE MISC: 1.0000 | 0 refills | Status: AC

## 2021-11-10 MED ORDER — BLOOD PRESSURE KIT DEVI: 1.0000 | 0 refills | Status: AC

## 2021-11-11 ENCOUNTER — Encounter: Payer: Medicaid Other | Admitting: Advanced Practice Midwife

## 2021-11-11 DIAGNOSIS — Z3480 Encounter for supervision of other normal pregnancy, unspecified trimester: Secondary | ICD-10-CM

## 2021-11-11 NOTE — Progress Notes (Deleted)
Subjective:   Stephanie Madden is a 25 y.o. G2P0 at [redacted]w[redacted]d by {Ob dating:14516} being seen today for her first obstetrical visit.  Her obstetrical history is significant for {ob risk factors:10154} and has Intrauterine pregnancy; Pain, dental; and Supervision of other normal pregnancy, antepartum on their problem list.. Patient {does/does not:19097} intend to breast feed. Pregnancy history fully reviewed.  Patient reports {sx:14538}.  HISTORY: OB History  Gravida Para Term Preterm AB Living  2 0 0 0 0 0  SAB IAB Ectopic Multiple Live Births  0 0 0 0 0    # Outcome Date GA Lbr Len/2nd Weight Sex Delivery Anes PTL Lv  2 Current           1 Gravida            Past Medical History:  Diagnosis Date   Seasonal allergies    No past surgical history on file. Family History  Problem Relation Age of Onset   Healthy Mother    Healthy Father    Social History   Tobacco Use   Smoking status: Never   Smokeless tobacco: Never  Vaping Use   Vaping Use: Some days   Start date: 04/20/2020  Substance Use Topics   Alcohol use: No   Drug use: Yes    Types: Marijuana    Comment: last use was a couple weeks ago from 05/28/20   No Known Allergies Current Outpatient Medications on File Prior to Visit  Medication Sig Dispense Refill   albuterol (PROVENTIL HFA;VENTOLIN HFA) 108 (90 BASE) MCG/ACT inhaler Inhale 1-2 puffs into the lungs every 6 (six) hours as needed for wheezing or shortness of breath. 1 Inhaler 0   Blood Pressure Monitoring (BLOOD PRESSURE KIT) DEVI 1 kit by Does not apply route once a week. Check Blood Pressure regularly and record readings into the [employer] App.  Large Cuff.  DX O90.0 1 each 0   Misc. Devices (GOJJI WEIGHT SCALE) MISC 1 Device by Does not apply route once a week. Check weight weekly and record data in the Bab[employer] App. 1 each 0   potassium chloride SA (KLOR-CON) 20 MEQ tablet Take 1 tablet (20 mEq total) by mouth 2 (two) times daily. 10 tablet 0    Prenatal Vit-Fe Fumarate-FA (PRENATAL MULTIVITAMIN) TABS tablet Take 1 tablet by mouth daily at 12 noon.     promethazine (PHENERGAN) 25 MG tablet Take 1 tablet (25 mg total) by mouth every 6 (six) hours as needed for nausea or vomiting. 30 tablet 0   No current facility-administered medications on file prior to visit.    *** Indications for ASA therapy (per uptodate) One of the following: Previous pregnancy with preeclampsia, especially early onset and with an adverse outcome {yes/no:20286} Multifetal gestation {yes/no:20286} Chronic hypertension {yes/no:20286} Type 1 or 2 diabetes mellitus {yes/no:20286} Chronic kidney disease {yes/no:20286} Autoimmune disease (antiphospholipid syndrome, systemic lupus erythematosus) {yes/no:20286}  *** Two or more of the following: Nulliparity {yes/no:20286} Obesity (body mass index >30 kg/m2) {yes/no:20286} Family history of preeclampsia in mother or sister {yes/no:20286} Age ?35 years {yes/no:20286} Sociodemographic characteristics (African American race, low socioeconomic level) {yes/no:20286} Personal risk factors (eg, previous pregnancy with low birth weight or small for gestational age infant, previous adverse pregnancy outcome [eg, stillbirth], interval >10 years between pregnancies) {yes/no:20286}  *** Indications for early 1 hour GTT (per uptodate)  BMI >25 (>23 in Asian women) AND one of the following  Gestational diabetes mellitus in a previous pregnancy {yes/no:20286} Glycated hemoglobin ?5.7 percent (  39 mmol/mol), impaired glucose tolerance, or impaired fasting glucose on previous testing {yes/no:20286} First-degree relative with diabetes {yes/no:20286} High-risk race/ethnicity (eg, African American, Latino, Native American, [place] American, [place]er) {yes/no:20286} History of cardiovascular disease {yes/no:20286} Hypertension or on therapy for hypertension {yes/no:20286} High-density lipoprotein cholesterol level <35 mg/dL  (0.90 mmol/L) and/or a triglyceride level >250 mg/dL (2.82 mmol/L) {yes/no:20286} Polycystic ovary syndrome {yes/no:20286} Physical inactivity {yes/no:20286} Other clinical condition associated with insulin resistance (eg, severe obesity, acanthosis nigricans) {yes/no:20286} Previous birth of an infant weighing ?4000 g {yes/no:20286} Previous stillbirth of unknown cause {yes/no:20286} Exam   There were no vitals filed for this visit.    Uterus:     Pelvic Exam: Perineum: no hemorrhoids, normal perineum   Vulva: normal external genitalia, no lesions   Vagina:  normal mucosa, normal discharge   Cervix: no lesions and normal, pap smear done.    Adnexa: normal adnexa and no mass, fullness, tenderness   Bony Pelvis: average  System: General: well-developed, well-nourished female in no acute distress   Breast:  normal appearance, no masses or tenderness   Skin: normal coloration and turgor, no rashes   Neurologic: oriented, normal, negative, normal mood   Extremities: normal strength, tone, and muscle mass, ROM of all joints is normal   HEENT PERRLA, extraocular movement intact and sclera clear, anicteric   Mouth/Teeth mucous membranes moist, pharynx normal without lesions and dental hygiene good   Neck supple and no masses   Cardiovascular: regular rate and rhythm   Respiratory:  no respiratory distress, normal breath sounds   Abdomen: soft, non-tender; bowel sounds normal; no masses,  no organomegaly     Assessment:   Pregnancy: G2P0 Patient Active Problem List   Diagnosis Date Noted   Supervision of other normal pregnancy, antepartum 11/10/2021   Intrauterine pregnancy 05/28/2020   Pain, dental 05/28/2020     Plan:  1. Supervision of other normal pregnancy, antepartum ***    Initial labs drawn. Continue prenatal vitamins. Discussed and offered genetic screening options, including Quad screen/AFP, NIPS testing, and option to decline testing. Benefits/risks/alternatives  reviewed. Pt aware that anatomy US is form of genetic screening with lower accuracy in detecting trisomies than blood work.  Pt chooses/declines genetic screening today. {Blank multiple:19196::"First trimester screen","Quad screen","NIPS"}: {requests/ordered/declines:14581}. Ultrasound discussed; fetal anatomic survey: {requests/ordered/declines:14581}. Problem list reviewed and updated. The nature of [place] with multiple MDs and other Advanced Practice Providers was explained to patient; also emphasized that residents, students are part of our team. Routine obstetric precautions reviewed. No follow-ups on file.   Fatima Blank, CNM 11/11/21 9:13 AM

## 2021-12-27 NOTE — Progress Notes (Signed)
Chart reviewed for nurse visit. Agree with plan of care.   Marylene Land, CNM 12/27/2021 3:25 AM

## 2024-01-16 ENCOUNTER — Other Ambulatory Visit: Payer: Self-pay

## 2024-01-16 ENCOUNTER — Encounter (HOSPITAL_COMMUNITY): Payer: Self-pay

## 2024-01-16 ENCOUNTER — Emergency Department (HOSPITAL_COMMUNITY): Payer: Medicaid Other

## 2024-01-16 ENCOUNTER — Emergency Department (HOSPITAL_COMMUNITY)
Admission: EM | Admit: 2024-01-16 | Discharge: 2024-01-16 | Disposition: A | Discharge status: Home / Self Care | Payer: Medicaid Other | Attending: Emergency Medicine | Admitting: Emergency Medicine

## 2024-01-16 DIAGNOSIS — X503XXA Overexertion from repetitive movements, initial encounter: Secondary | ICD-10-CM | POA: Insufficient documentation

## 2024-01-16 DIAGNOSIS — S63501A Unspecified sprain of right wrist, initial encounter: Secondary | ICD-10-CM | POA: Insufficient documentation

## 2024-01-16 DIAGNOSIS — Y990 Civilian activity done for income or pay: Secondary | ICD-10-CM | POA: Insufficient documentation

## 2024-01-16 DIAGNOSIS — S6991XA Unspecified injury of right wrist, hand and finger(s), initial encounter: Secondary | ICD-10-CM | POA: Diagnosis present

## 2024-01-16 NOTE — Discharge Instructions (Signed)
Wear the wrist brace pretty constantly for the next week and then you can get it out and start doing range of motion.  If you are still having a lot of pain you should follow-up with the specialist.  Your x-rays today were normal.  You may have to switch to a job where you are not doing as much lifting as well but see how you feel over the next few weeks.

## 2024-01-16 NOTE — ED Provider Notes (Addendum)
[place] EMERGENCY DEPARTMENT AT [facility] Provider Note   CSN: [id number] Arrival date & time: 01/16/24  3244     History  Chief Complaint  Patient presents with   Wrist Pain    Stephanie Madden is a 28 y.o. female.  Patient is a 28 year old female with a history of prior injury to the right arm who is presenting today with complaints of wrist pain that has been progressive over the last 2 weeks.  It is mostly in the lateral portion of the wrist.  She thinks it is something she most likely did at work.  She does do a lot of lifting at work and is thinking that is what caused it as she has had no other trauma.  It is not painful if she is not moving it but any type of flexion or extension of the wrist causes pain.  She has not had any numbness or tingling in her fingers.  No swelling noted.  She has tried taping it which helps a little bit but the pain has not resolved.  The history is provided by the patient.  Wrist Pain       Home Medications Prior to Admission medications   Medication Sig Start Date End Date Taking? Authorizing Provider  albuterol (PROVENTIL HFA;VENTOLIN HFA) 108 (90 BASE) MCG/ACT inhaler Inhale 1-2 puffs into the lungs every 6 (six) hours as needed for wheezing or shortness of breath. 12/11/13   Santiago Glad, PA-C  Blood Pressure Monitoring (BLOOD PRESSURE KIT) DEVI 1 kit by Does not apply route once a week. Check Blood Pressure regularly and record readings into the Bab[employer] App.  Large Cuff.  DX O90.0 11/10/21   Leftwich-Kirby, Wilmer Floor, CNM  Misc. Devices (GOJJI WEIGHT SCALE) MISC 1 Device by Does not apply route once a week. Check weight weekly and record data in the Babyscripts App. 11/10/21   Leftwich-Kirby, Wilmer Floor, CNM  potassium chloride SA (KLOR-CON) 20 MEQ tablet Take 1 tablet (20 mEq total) by mouth 2 (two) times daily. 10/19/21   Brand Males, CNM  Prenatal Vit-Fe Fumarate-FA (PRENATAL MULTIVITAMIN) TABS tablet Take 1 tablet  by mouth daily at 12 noon.    [provider]  promethazine (PHENERGAN) 25 MG tablet Take 1 tablet (25 mg total) by mouth every 6 (six) hours as needed for nausea or vomiting. 10/19/21   Brand Males, CNM      Allergies    Patient has no known allergies.    Review of Systems   Review of Systems  Physical Exam Updated Vital Signs BP 122/75 (BP Location: Left Arm)   Pulse (!) 56   Temp 97.9 F (36.6 C)   Resp 14   Ht 5\' 3"  (1.6 m)   Wt 48.5 kg   SpO2 96%   BMI 18.95 kg/m  Physical Exam Vitals and nursing note reviewed.  Cardiovascular:     Pulses: Normal pulses.  Musculoskeletal:     Right wrist: Tenderness present. Normal range of motion.     Comments: Pain with palpation over the right ulna.  No snuffbox tenderness.  Pain with wrist flexion and extension.  No swelling noted.  Normal sensation and movement of the fingers of the right hand  Skin:    General: Skin is warm.  Neurological:     Mental Status: She is alert. Mental status is at baseline.     ED Results / Procedures / Treatments   Labs (all labs ordered are listed, but  only abnormal results are displayed) Labs Reviewed - No data to display  EKG None  Radiology DG Wrist Complete Right Result Date: 01/16/2024 CLINICAL DATA:  Right wrist pain after heavy lifting. EXAM: RIGHT WRIST - COMPLETE 3+ VIEW COMPARISON:  None Available. FINDINGS: There is no evidence of fracture or dislocation. There is no evidence of arthropathy or other focal bone abnormality. Soft tissues are unremarkable. IMPRESSION: Negative. Electronically Signed   By: Danae Orleans M.D.   On: 01/16/2024 09:51    Procedures Procedures    Medications Ordered in ED Medications - No data to display  ED Course/ Medical Decision Making/ A&P                                 Medical Decision Making Amount and/or Complexity of Data Reviewed Radiology: ordered and independent interpretation performed. Decision-making details  documented in ED Course.   Patient presenting with wrist pain.  Most likely overuse injury from heavy lifting at work.  Will place in a wrist splint and have her follow-up with orthopedics if it is not improved.  Will put her on light duty.  I have independently visualized and interpreted pt's images today.  Wrist image is neg for fracture.        Final Clinical Impression(s) / ED Diagnoses Final diagnoses:  Sprain of right wrist, initial encounter    Rx / DC Orders ED Discharge Orders     None         Gwyneth Sprout, MD 01/16/24 7829    Gwyneth Sprout, MD 01/16/24 1005

## 2024-01-16 NOTE — ED Triage Notes (Signed)
Patient reports old right wrist injury but has been doing heavy lifting at work and now right wrist is throbbing.  Denies numbness or tingling.
